# Patient Record
Sex: Female | Born: 1975 | Race: White | Hispanic: No | Marital: Single | Smoking: Former smoker
Health system: Southern US, Community
[De-identification: ages and names within clinical notes are randomized; demographics above are authoritative.]

## PROBLEM LIST (undated history)

## (undated) HISTORY — PX: TUBAL LIGATION: SHX77

## (undated) HISTORY — PX: KIDNEY STONE SURGERY: SHX686

---

## 2015-12-19 ENCOUNTER — Encounter (HOSPITAL_COMMUNITY): Payer: Self-pay | Admitting: Emergency Medicine

## 2015-12-19 ENCOUNTER — Emergency Department (HOSPITAL_COMMUNITY)
Admission: EM | Admit: 2015-12-19 | Discharge: 2015-12-20 | Disposition: A | Discharge status: Home / Self Care | Payer: Medicaid Other | Attending: Emergency Medicine | Admitting: Emergency Medicine

## 2015-12-19 ENCOUNTER — Emergency Department (HOSPITAL_COMMUNITY): Payer: Medicaid Other

## 2015-12-19 DIAGNOSIS — M25552 Pain in left hip: Secondary | ICD-10-CM | POA: Insufficient documentation

## 2015-12-19 DIAGNOSIS — G8929 Other chronic pain: Secondary | ICD-10-CM | POA: Diagnosis not present

## 2015-12-19 DIAGNOSIS — Z87891 Personal history of nicotine dependence: Secondary | ICD-10-CM | POA: Insufficient documentation

## 2015-12-19 MED ORDER — HYDROCODONE-ACETAMINOPHEN 5-325 MG PO TABS
0.5000 | ORAL_TABLET | Freq: Once | ORAL | Status: AC
Start: 2015-12-19 — End: 2015-12-19
  Administered 2015-12-19: 0.5 via ORAL
  Filled 2015-12-19: qty 1

## 2015-12-19 NOTE — ED Notes (Signed)
Patient here with complaint of left hip pain. States history of femoral neck fracture secondary to boot camp in 1998. Endorses chronic pain in the left hip, but it feels different today. Able to ambulate, but pain is exacerbated.

## 2015-12-19 NOTE — ED Provider Notes (Signed)
CSN: [id number]     Arrival date & time 12/19/15  2200 History  By signing my name below, I, Erling Conte, attest that this documentation has been prepared under the direction and in the presence of Montine Circle, PA-C Electronically Signed: Erling Conte, ED Scribe. 12/19/2015. 11:23 PM.     Chief Complaint  Patient presents with  . Hip Pain   The history is provided by the patient. No language interpreter was used.    HPI Comments: Crystal Oconnell is a 39 y.o. female who presents to the Emergency Department complaining of constant, moderate, left hip pain onset 4 days. She reports that she has a h/o bursitis in her b/l hips but states this feels different and is in a different location; she states the bursitis is usually in her groin and this pain is to her lateral left hip. Pt also notes that she has a h/o femoral stress fracture that happened 18 years ago. She reports the pain is exacerbated with ambulation and bearing weight. Pt notes when she walks his left hip feels unsteady and like it is no longer going to support her weight. She has not taken any medications prior to arrival. Pt denies any other aggravating/alleviating factors. She denies any new injury or trauma to the hip. Pt denies any swelling, bruising, redness, numbness, weakness or other associated symptoms at this time.  History reviewed. No pertinent past medical history. Past Surgical History  Procedure Laterality Date  . Tubal ligation    . Kidney stone surgery     History reviewed. No pertinent family history. Social History  Substance Use Topics  . Smoking status: Former Research scientist (life sciences)  . Smokeless tobacco: None  . Alcohol Use: Yes   OB History    No data available     Review of Systems  Musculoskeletal: Positive for arthralgias. Negative for joint swelling and gait problem.  Skin: Negative for color change.  Neurological: Negative for weakness and numbness.      Allergies  Review of patient's allergies  indicates no known allergies.  Home Medications   Prior to Admission medications   Not on File   Triage Vitals: BP 143/91 mmHg  Pulse 94  Temp(Src) 98.2 F (36.8 C) (Oral)  Resp 18  Ht 5' 6"  (1.676 m)  Wt 205 lb (92.987 kg)  BMI 33.10 kg/m2  SpO2 100%  LMP 11/25/2015 (Exact Date)  Physical Exam  Physical Exam  Constitutional: Pt appears well-developed and well-nourished. No distress.  HENT:  Head: Normocephalic and atraumatic.  Eyes: Conjunctivae are normal.  Neck: Normal range of motion.  Cardiovascular: Normal rate, regular rhythm and intact distal pulses.   Capillary refill < 3 sec  Pulmonary/Chest: Effort normal and breath sounds normal.  Musculoskeletal: Pt exhibits tenderness. Pt exhibits no edema.  ROM: 5/5  Neurological: Pt  is alert. Coordination normal.  Sensation intact Strength 5/5  Skin: Skin is warm and dry. Pt is not diaphoretic.  No tenting of the skin  Psychiatric: Pt has a normal mood and affect.  Nursing note and vitals reviewed.   ED Course  Procedures (including critical care time)  DIAGNOSTIC STUDIES: Oxygen Saturation is 100% on RA, normal by my interpretation.    COORDINATION OF CARE:  11:24 PM- Will order imaging of left hip and 0.5 tablet of 5-325 Vicodin. Pt requests low dose of pain medicine due to her h/o low tolerance and how drowsy the medications make her. Pt advised of plan for treatment and pt agrees.  Imaging Review Dg Hip Unilat W Or W/o Pelvis Min 4 Views Left  12/19/2015  CLINICAL DATA:  Left hip pain EXAM: DG HIP (WITH OR WITHOUT PELVIS) 4+V LEFT COMPARISON:  None FINDINGS: There is no evidence of hip fracture or dislocation. There is no evidence of arthropathy or other focal bone abnormality. IMPRESSION: Negative. Electronically Signed   By: Kerby Moors M.D.   On: 12/19/2015 23:47   I have personally reviewed and evaluated these images as part of my medical decision-making.    MDM   Final diagnoses:  Hip  pain, left    Patient with acute on chronic left hip pain, no injuries, no erythema, no evidence of septic joint, bony abnormality or deformity. Unclear etiology of pain this time. Plain films are negative. Will recommend orthopedic follow-up. Patient is ambulatory.  I personally performed the services described in this documentation, which was scribed in my presence. The recorded information has been reviewed and is accurate.       Montine Circle, PA-C 12/20/15 3202  Everlene Balls, MD 12/20/15 [phone]

## 2015-12-20 MED ORDER — HYDROCODONE-ACETAMINOPHEN 5-325 MG PO TABS: 1.0000 | ORAL_TABLET | Freq: Four times a day (QID) | ORAL | Status: AC | PRN

## 2015-12-20 NOTE — ED Notes (Signed)
Pt departed with significant other and in NAD.

## 2015-12-20 NOTE — Discharge Instructions (Signed)

## 2015-12-29 ENCOUNTER — Other Ambulatory Visit: Payer: Self-pay

## 2015-12-29 DIAGNOSIS — Z1231 Encounter for screening mammogram for malignant neoplasm of breast: Secondary | ICD-10-CM

## 2016-01-11 ENCOUNTER — Ambulatory Visit
Admission: RE | Admit: 2016-01-11 | Discharge: 2016-01-11 | Disposition: A | Discharge status: Home / Self Care | Payer: Medicaid Other | Source: Ambulatory Visit

## 2016-01-11 DIAGNOSIS — Z1231 Encounter for screening mammogram for malignant neoplasm of breast: Secondary | ICD-10-CM

## 2016-09-22 ENCOUNTER — Emergency Department (HOSPITAL_COMMUNITY)

## 2016-09-22 ENCOUNTER — Encounter (HOSPITAL_COMMUNITY): Payer: Self-pay | Admitting: Emergency Medicine

## 2016-09-22 ENCOUNTER — Emergency Department (HOSPITAL_COMMUNITY)
Admission: EM | Admit: 2016-09-22 | Discharge: 2016-09-22 | Disposition: A | Discharge status: Home / Self Care | Attending: Emergency Medicine | Admitting: Emergency Medicine

## 2016-09-22 DIAGNOSIS — M79671 Pain in right foot: Secondary | ICD-10-CM | POA: Diagnosis present

## 2016-09-22 DIAGNOSIS — Z87891 Personal history of nicotine dependence: Secondary | ICD-10-CM | POA: Insufficient documentation

## 2016-09-22 MED ORDER — CYCLOBENZAPRINE HCL 10 MG PO TABS: 10.0000 mg | ORAL_TABLET | Freq: Two times a day (BID) | ORAL | 0 refills | Status: AC | PRN

## 2016-09-22 MED ORDER — NAPROXEN 500 MG PO TABS: 500.0000 mg | ORAL_TABLET | Freq: Two times a day (BID) | ORAL | 0 refills | Status: AC | PRN

## 2016-09-22 NOTE — ED Notes (Signed)
Pt declined discharge prescriptions. EDP aware.

## 2016-09-22 NOTE — ED Triage Notes (Signed)
Right foot pain for a few weeks. Pain on lateral side of right foot and has progressed over the few weeks into ankle. No known injury. Hurts to walk on it and move 5th toe. No swelling or deformity noted.

## 2016-09-22 NOTE — ED Triage Notes (Signed)
PT did not receive ASO  And PA aware. Pt reported  To PA she several  Splints at home.

## 2016-09-22 NOTE — ED Triage Notes (Signed)
Pt reports due to pain she can not tol. ASO splint. Tran,PA informed.

## 2016-09-22 NOTE — Discharge Instructions (Signed)
You have been evaluated for your right foot pain.  You have a bone spur which may contribute to your pain.  Wear ankle brace for compression and support.  Take naproxen and flexeril as needed.  Follow up with orthopedist if no improvement in 1 week.

## 2016-09-22 NOTE — ED Provider Notes (Signed)
[facility] DEPT Provider Note   CSN: [id number] Arrival date & time: 09/22/16  1711  By signing my name below, I, Crystal Oconnell, attest that this documentation has been prepared under the direction and in the presence of Domenic Moras, PA-C.  Electronically Signed: Reola Oconnell, ED Scribe. 09/22/16. 5:56 PM.  History   Chief Complaint Chief Complaint  Patient presents with  . Foot Pain   The history is provided by the patient. No language interpreter was used.   HPI Comments: Crystal Oconnell is a 40 y.o. female with no other PMHx, who presents to the Emergency Department complaining of gradual onset, intermittent right foot pain onset more than one month ago, worsening over the past week. She describes her pain as sharp and burning and she rates it 3/10. Pt reports that her pain initially began in the lateral aspect of the foot, and has gradually been radiating into her right ankle. 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 Pt additional notes that her pain has been keeping her up at night. She states that her pain is exacerbated with ambulation and weight bearing. Pt has been taking 459m Ibuprofen daily for her pain with minimal relief. No hx of DM or Gout. No recent change in footwear. Denies hip pain, knee pain, or any other associated symptoms. No hx of PE/DVT, no recent surgery, prolonged bed rest, unilateral leg swelling, calf pain, active cancer or hemoptysis. No CP or SOB.    History reviewed. No pertinent past medical history.  There are no active problems to display for this patient.  Past Surgical History:  Procedure Laterality Date  . KIDNEY STONE SURGERY    . TUBAL LIGATION     OB History    No data available     Home Medications    Prior to Admission medications   Medication Sig Start Date End Date Taking? Authorizing Provider    HYDROcodone-acetaminophen (NORCO/VICODIN) 5-325 MG tablet Take 1 tablet by mouth every 6 (six) hours as needed. 12/20/15   RMontine Circle PA-C   Family History History reviewed. No pertinent family history.  Social History Social History  Substance Use Topics  . Smoking status: Former SResearch scientist (life sciences) . Smokeless tobacco: Never Used  . Alcohol use Yes   Allergies   Review of patient's allergies indicates no known allergies.  Review of Systems Review of Systems  Constitutional: Negative for fever.  Musculoskeletal: Positive for arthralgias (right ankle) and myalgias.  Psychiatric/Behavioral: Negative for confusion.   Physical Exam Updated Vital Signs BP 122/88 (BP Location: Left Arm)   Pulse 117   Temp 98.6 F (37 C) (Oral)   Resp 16   Ht 5' 6"  (1.676 m)   Wt 201 lb (91.2 kg)   LMP 09/11/2016 (Exact Date)   SpO2 100%   BMI 32.44 kg/m   Physical Exam  Constitutional: She appears well-developed and well-nourished.  HENT:  Head: Normocephalic.  Eyes: Conjunctivae are normal.  Cardiovascular: Normal rate.   Pulmonary/Chest: Effort normal. No respiratory distress.  Abdominal: She exhibits no distension.  Musculoskeletal: Normal range of motion. She exhibits tenderness.  Right foot with TTP noted to the lateral aspect of the mid foot and fifth metatarsal. Area is w/o overlaying skin changes, swelling, or deformity. Brisk cap refill to all toes. DP and TP pulses are palpable. Leg compartments area all soft. No palpable cords. No  erythema or edema. Normal dorsiflexion and plantar flexion. Pain noted with foot inversion.   Neurological: She is alert.  Skin: Skin is warm and dry.  Psychiatric: She has a normal mood and affect. Her behavior is normal.  Nursing note and vitals reviewed.  ED Treatments / Results  DIAGNOSTIC STUDIES: Oxygen Saturation is 100% on RA, normal by my interpretation.   COORDINATION OF CARE: 5:52 PM-Discussed next steps with pt. Pt verbalized  understanding and is agreeable with the plan.   Radiology Dg Foot Complete Right  Result Date: 09/22/2016 CLINICAL DATA:  Right foot pain, no injury. EXAM: RIGHT FOOT COMPLETE - 3+ VIEW COMPARISON:  None. FINDINGS: There is no evidence of fracture or dislocation. There is no evidence of arthropathy or other focal bone abnormality. Soft tissues are unremarkable. Incidental note made of mild spurring along the plantar margin of the posterior calcaneus. IMPRESSION: No acute findings.  Chronic calcaneal spur. Electronically Signed   By: Franki Cabot M.D.   On: 09/22/2016 18:12   Procedures Procedures (including critical care time)  Medications Ordered in ED Medications - No data to display  Initial Impression / Assessment and Plan / ED Course  I have reviewed the triage vital signs and the nursing notes.  Pertinent labs & imaging results that were available during my care of the patient were reviewed by me and considered in my medical decision making (see chart for details).  Clinical Course   Pt is a 40yo female who presents into the ED for right foot pain with unknown etiology. No trauma or injury to the area to sustain her pain. Patient XR is unremarkable for obvious fracture or dislocation; however does reveal chronic calcaneal spur. Pt declined pain medications while in the ED. Pt advised to follow up with orthopedics if symptoms persist for possibility of missed fracture diagnosis. Patient given brace while in ED; however was unable to tolerate application secondary to pain. Conservative therapy recommended and discussed. Patient will be d/c home & is agreeable with above plan with no other concerns at this time. All questions were answered prior to disposition.   6:34 PM Pt declined ASO brace, therefore please do not charge pt for ASO brace.    Final Clinical Impressions(s) / ED Diagnoses   Final diagnoses:  Foot pain, right   New Prescriptions New Prescriptions   CYCLOBENZAPRINE  (FLEXERIL) 10 MG TABLET    Take 1 tablet (10 mg total) by mouth 2 (two) times daily as needed for muscle spasms.   NAPROXEN (NAPROSYN) 500 MG TABLET    Take 1 tablet (500 mg total) by mouth 2 (two) times daily between meals as needed.   I personally performed the services described in this documentation, which was scribed in my presence. The recorded information has been reviewed and is accurate.       Domenic Moras, PA-C 09/22/16 [place], DO 09/22/16 2210

## 2017-02-14 ENCOUNTER — Other Ambulatory Visit: Payer: Self-pay | Admitting: Family Medicine

## 2017-02-14 DIAGNOSIS — Z1231 Encounter for screening mammogram for malignant neoplasm of breast: Secondary | ICD-10-CM

## 2017-03-06 ENCOUNTER — Ambulatory Visit
Admission: RE | Admit: 2017-03-06 | Discharge: 2017-03-06 | Disposition: A | Discharge status: Home / Self Care | Source: Ambulatory Visit | Attending: Family Medicine | Admitting: Family Medicine

## 2017-03-06 DIAGNOSIS — Z1231 Encounter for screening mammogram for malignant neoplasm of breast: Secondary | ICD-10-CM

## 2017-07-28 IMAGING — CR DG HIP (WITH OR WITHOUT PELVIS) 4+V*L*
3 series · 3 of 3 positions shown · non-contrast
Comparison: None

CLINICAL DATA: Left hip pain

EXAM:
DG HIP (WITH OR WITHOUT PELVIS) 4+V LEFT

[pelvis ap]
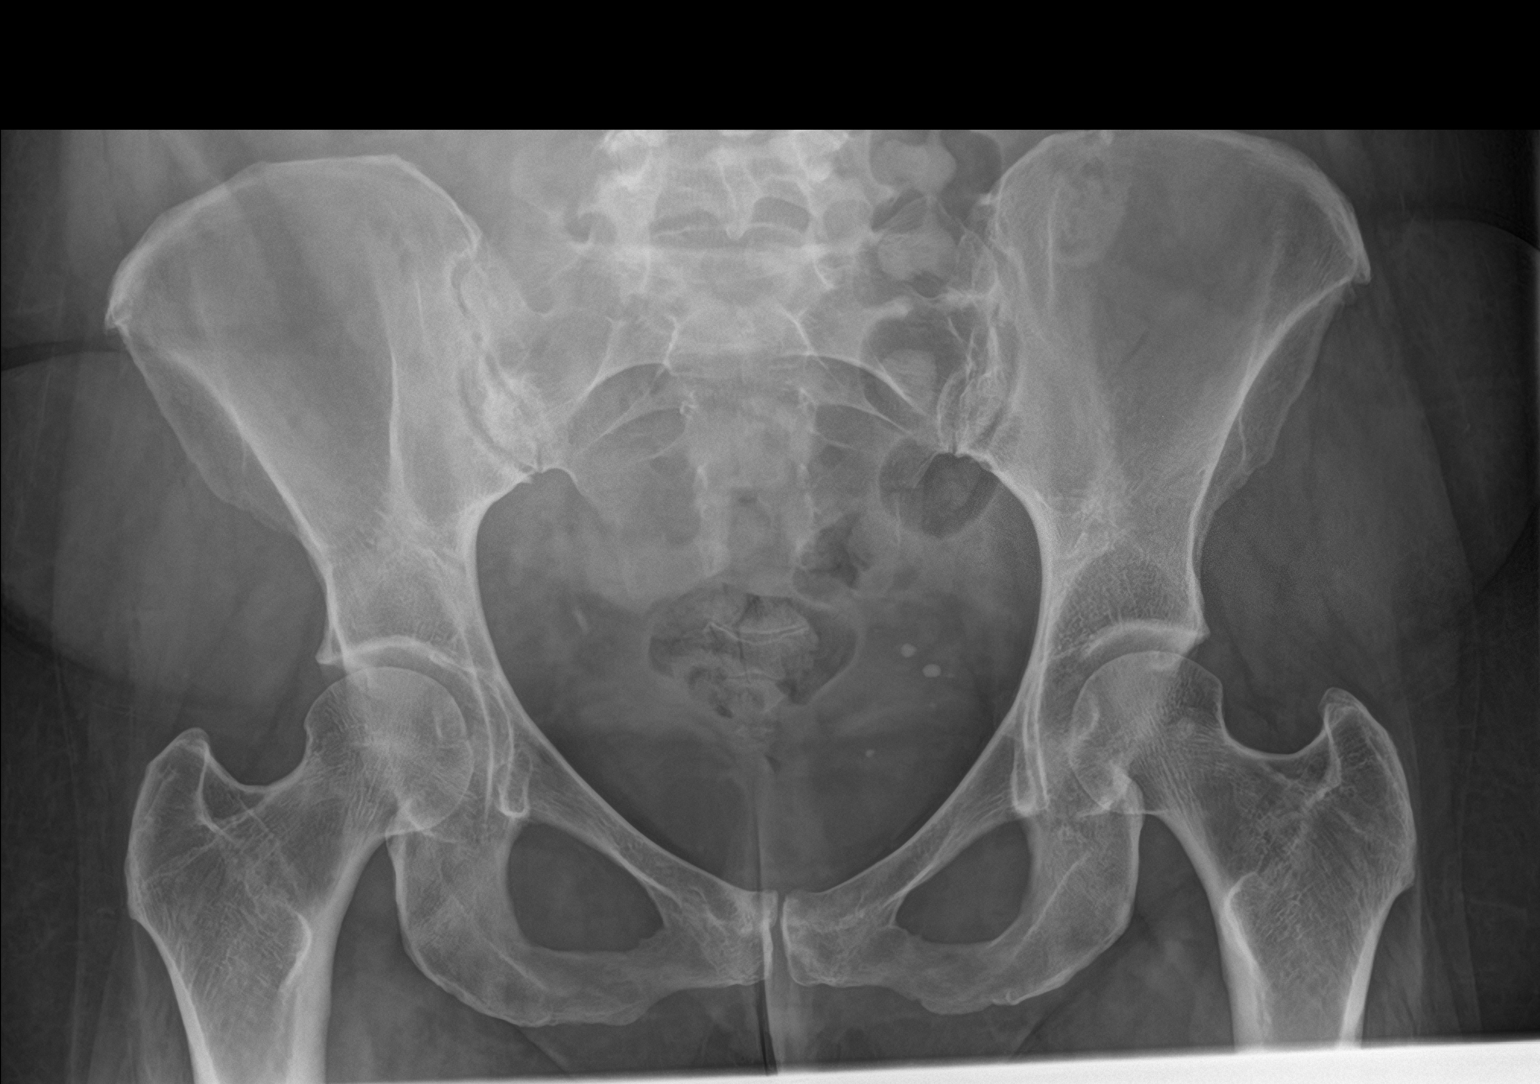

[hip ap]
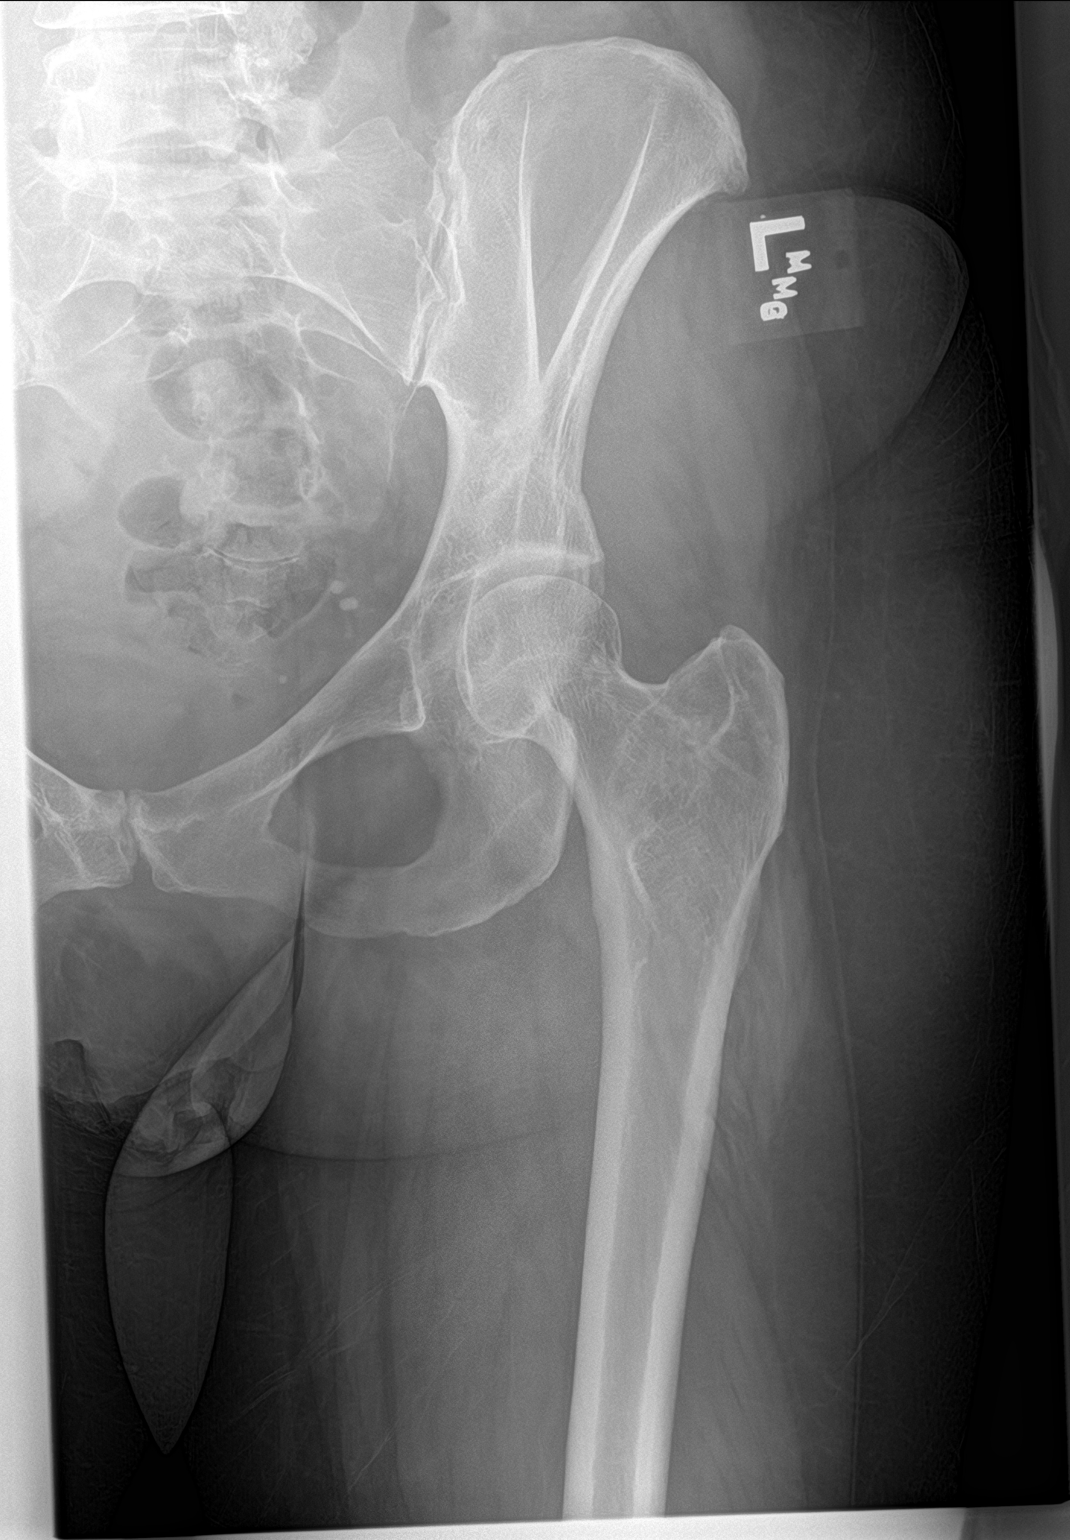

[hip lat]
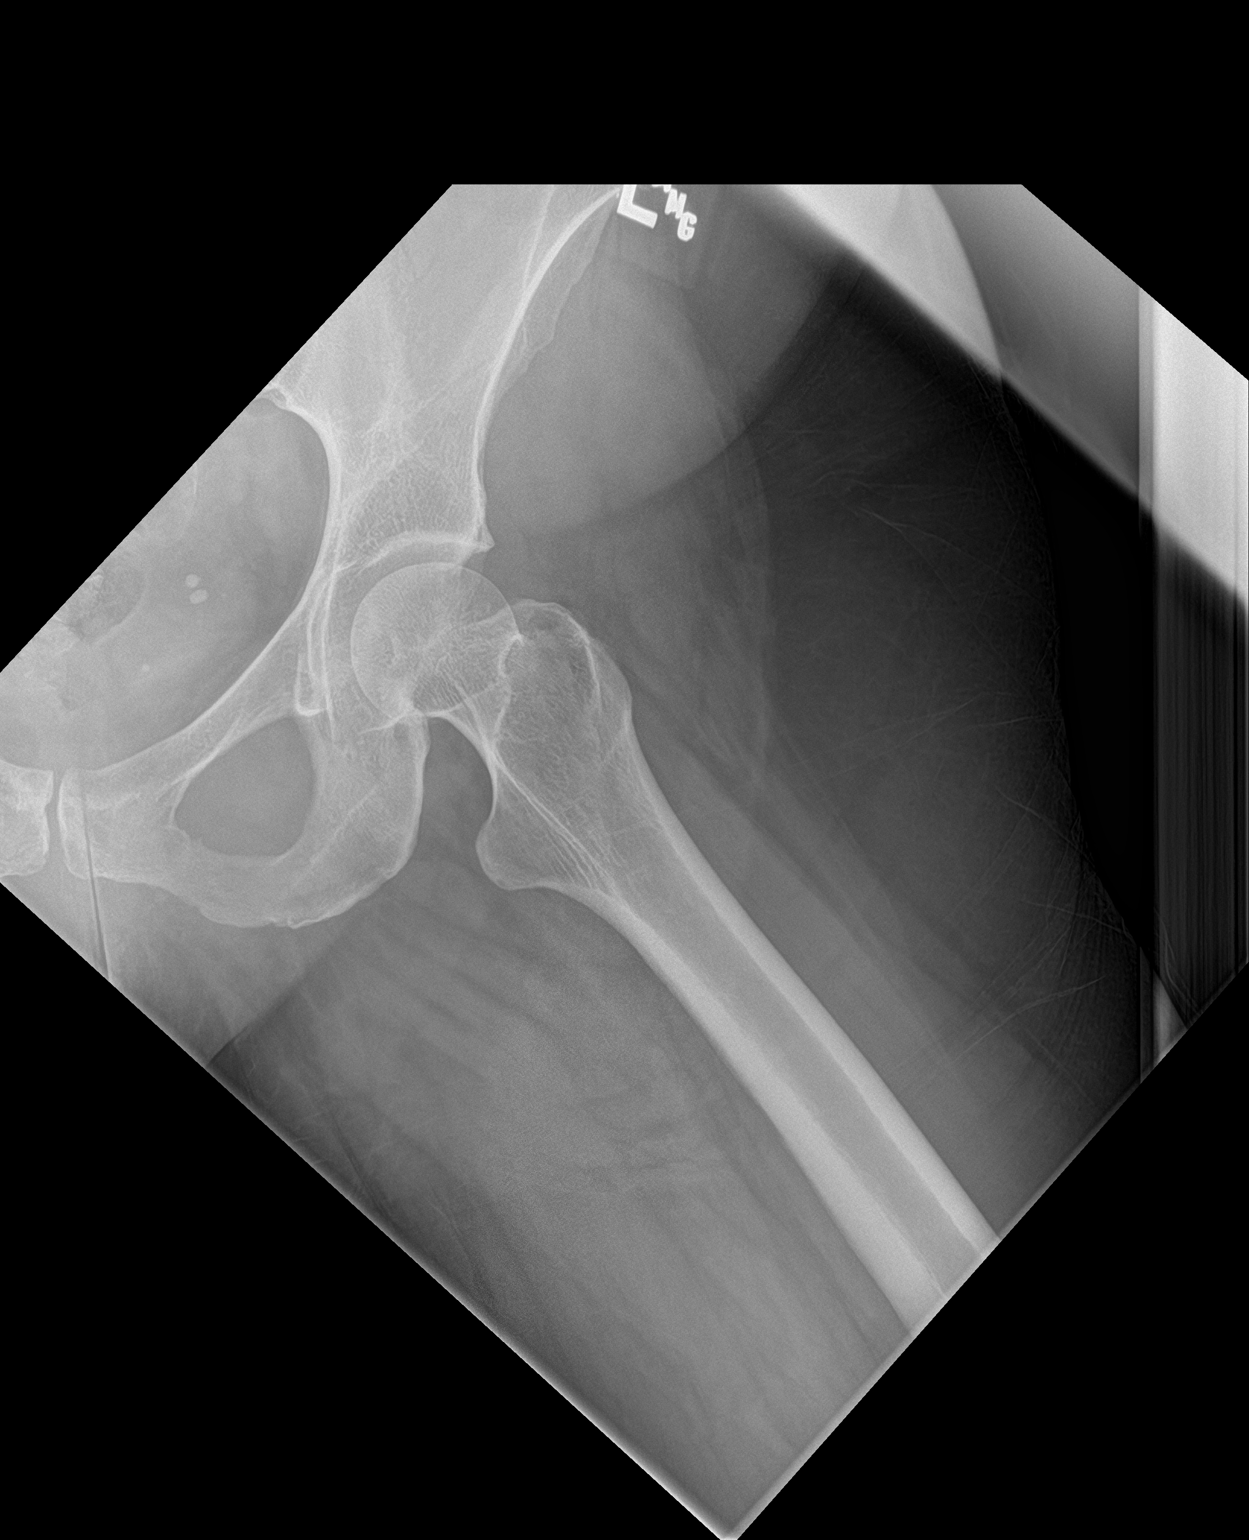

[3 of 3 positions shown; findings below may reference images not displayed]

FINDINGS: There is no evidence of hip fracture or dislocation. There is no
evidence of arthropathy or other focal bone abnormality.
IMPRESSION: Negative.
